# Patient Record
Sex: Male | Born: 1935 | Hispanic: No | Smoking: Never smoker
Health system: Southern US, Community
[De-identification: ages and names within clinical notes are randomized; demographics above are authoritative.]

## PROBLEM LIST (undated history)

## (undated) HISTORY — PX: HERNIA REPAIR: SHX51

---

## 2009-02-20 ENCOUNTER — Emergency Department: Payer: Self-pay | Admitting: Emergency Medicine

## 2011-05-08 ENCOUNTER — Emergency Department: Payer: Self-pay | Admitting: *Deleted

## 2012-06-27 ENCOUNTER — Emergency Department: Payer: Self-pay

## 2012-06-27 LAB — URINALYSIS, COMPLETE
Bacteria: NONE SEEN
Bilirubin,UR: NEGATIVE
Hyaline Cast: 1
Leukocyte Esterase: NEGATIVE
Nitrite: NEGATIVE
Ph: 5 (ref 4.5–8.0)
RBC,UR: 4 /HPF (ref 0–5)
Specific Gravity: 1.027 (ref 1.003–1.030)
WBC UR: 2 /HPF (ref 0–5)

## 2012-06-27 LAB — COMPREHENSIVE METABOLIC PANEL
Albumin: 4.2 g/dL (ref 3.4–5.0)
Anion Gap: 6 — ABNORMAL LOW (ref 7–16)
BUN: 15 mg/dL (ref 7–18)
Creatinine: 0.95 mg/dL (ref 0.60–1.30)
Glucose: 119 mg/dL — ABNORMAL HIGH (ref 65–99)
Osmolality: 281 (ref 275–301)
Potassium: 4 mmol/L (ref 3.5–5.1)
Sodium: 140 mmol/L (ref 136–145)
Total Protein: 7.8 g/dL (ref 6.4–8.2)

## 2012-06-27 LAB — CBC
HCT: 42.8 % (ref 40.0–52.0)
MCHC: 33.4 g/dL (ref 32.0–36.0)
MCV: 95 fL (ref 80–100)
Platelet: 197 10*3/uL (ref 150–440)
RBC: 4.53 10*6/uL (ref 4.40–5.90)
RDW: 13.4 % (ref 11.5–14.5)
WBC: 12.1 10*3/uL — ABNORMAL HIGH (ref 3.8–10.6)

## 2013-06-28 ENCOUNTER — Emergency Department: Payer: Self-pay | Admitting: Emergency Medicine

## 2013-06-28 LAB — CBC
HGB: 13.9 g/dL (ref 13.0–18.0)
MCH: 32.5 pg (ref 26.0–34.0)
MCHC: 34.6 g/dL (ref 32.0–36.0)
MCV: 94 fL (ref 80–100)
Platelet: 169 10*3/uL (ref 150–440)
RBC: 4.27 10*6/uL — ABNORMAL LOW (ref 4.40–5.90)
RDW: 13.5 % (ref 11.5–14.5)
WBC: 7.9 10*3/uL (ref 3.8–10.6)

## 2013-06-28 LAB — BASIC METABOLIC PANEL
Anion Gap: 3 — ABNORMAL LOW (ref 7–16)
BUN: 15 mg/dL (ref 7–18)
Chloride: 106 mmol/L (ref 98–107)
Creatinine: 1.39 mg/dL — ABNORMAL HIGH (ref 0.60–1.30)
EGFR (African American): 57 — ABNORMAL LOW
EGFR (Non-African Amer.): 50 — ABNORMAL LOW
Glucose: 138 mg/dL — ABNORMAL HIGH (ref 65–99)
Osmolality: 279 (ref 275–301)
Sodium: 138 mmol/L (ref 136–145)

## 2013-06-28 LAB — URINALYSIS, COMPLETE
Bacteria: NONE SEEN
Bilirubin,UR: NEGATIVE
Glucose,UR: NEGATIVE mg/dL (ref 0–75)
Ketone: NEGATIVE
Leukocyte Esterase: NEGATIVE
Protein: NEGATIVE
RBC,UR: 14 /HPF (ref 0–5)
Squamous Epithelial: <1
WBC UR: 2 /HPF (ref 0–5)

## 2013-06-28 LAB — HEPATIC FUNCTION PANEL A (ARMC)
Albumin: 3.8 g/dL (ref 3.4–5.0)
Alkaline Phosphatase: 58 U/L (ref 50–136)
Bilirubin,Total: 0.4 mg/dL (ref 0.2–1.0)

## 2013-06-28 LAB — LIPASE, BLOOD: Lipase: 107 U/L (ref 73–393)

## 2018-08-18 ENCOUNTER — Encounter: Payer: Self-pay | Admitting: Emergency Medicine

## 2018-08-18 ENCOUNTER — Emergency Department
Admission: EM | Admit: 2018-08-18 | Discharge: 2018-08-18 | Disposition: A | Discharge status: Home / Self Care | Payer: BLUE CROSS/BLUE SHIELD | Attending: Emergency Medicine | Admitting: Emergency Medicine

## 2018-08-18 ENCOUNTER — Other Ambulatory Visit: Payer: Self-pay

## 2018-08-18 ENCOUNTER — Emergency Department: Payer: BLUE CROSS/BLUE SHIELD

## 2018-08-18 DIAGNOSIS — G8929 Other chronic pain: Secondary | ICD-10-CM | POA: Insufficient documentation

## 2018-08-18 DIAGNOSIS — M1711 Unilateral primary osteoarthritis, right knee: Secondary | ICD-10-CM

## 2018-08-18 DIAGNOSIS — M25561 Pain in right knee: Secondary | ICD-10-CM | POA: Insufficient documentation

## 2018-08-18 MED ORDER — OXYCODONE HCL 5 MG PO TABS: 5.0000 mg | ORAL_TABLET | Freq: Three times a day (TID) | ORAL | 0 refills | Status: AC | PRN

## 2018-08-18 NOTE — Discharge Instructions (Signed)
Please follow-up with your primary care doctor as scheduled on Monday. Return to the emergency department for symptoms of change or worsen if you are unable to wait for that appointment.

## 2018-08-18 NOTE — ED Notes (Signed)
See triage note. Pt c/o R knee pain. States he has osteoarthritis in this knee but pain is usually not this bad. Has taken hydrocodone at home without relief.

## 2018-08-18 NOTE — ED Triage Notes (Signed)
Pt to ED from home c/o right knee pain, states usually goes to [facility] but pain more severe today.  Denies injury, no obvious swelling or redness, wearing brace for support.

## 2018-08-18 NOTE — ED Provider Notes (Signed)
[facility] Emergency Department Provider Note ____________________________________________  Time seen: Approximately 5:00 PM  I have reviewed the triage vital signs and the nursing notes.   HISTORY  Chief Complaint Knee Pain    HPI Johnny Beard is a 82 y.o. male who presents to the emergency department for evaluation and treatment of right knee pain.  Pain is acute on chronic.  Patient is being followed by his primary care provider for pain.  He was prescribed hydrocodone which has not helped.  He has been referred to orthopedics, but that appointment is not until mid December.  He is requesting something stronger for pain.  History reviewed. No pertinent past medical history.  There are no active problems to display for this patient.   Past Surgical History:  Procedure Laterality Date  . HERNIA REPAIR      Prior to Admission medications   Medication Sig Start Date End Date Taking? Authorizing Provider  oxyCODONE (ROXICODONE) 5 MG immediate release tablet Take 1 tablet (5 mg total) by mouth every 8 (eight) hours as needed. 08/18/18 08/18/19  Chinita Pester, FNP    Allergies Patient has no known allergies.  History reviewed. No pertinent family history.  Social History Social History   Tobacco Use  . Smoking status: Never Smoker  . Smokeless tobacco: Never Used  Substance Use Topics  . Alcohol use: Never    Frequency: Never  . Drug use: Never    Review of Systems Constitutional: Negative for fever. Cardiovascular: Negative for chest pain. Respiratory: Negative for shortness of breath. Musculoskeletal: Positive for right knee pain. Skin: Negative for open wound or lesion over exposed skin surfaces. Neurological: Negative for decrease in sensation  ____________________________________________   PHYSICAL EXAM:  VITAL SIGNS: ED Triage Vitals [08/18/18 1215]  Enc Vitals Group     BP (!) 163/77     Pulse Rate 98     Resp 18     Temp 97.9 F (36.6 C)     Temp Source Oral     SpO2 96 %     Weight 130 lb (59 kg)     Height 5\' 8"  (1.727 m)     Head Circumference      Peak Flow      Pain Score 8     Pain Loc      Pain Edu?      Excl. in GC?     Constitutional: Alert and oriented. Well appearing and in no acute distress. Eyes: Conjunctivae are clear without discharge or drainage Head: Atraumatic Neck: Supple. Respiratory: No cough. Respirations are even and unlabored. Musculoskeletal: Diffuse tenderness over the anterior right knee without obvious joint effusion.  Homans sign is negative.  There is no erythema or edema in the right calf. Neurologic: Awake, alert, oriented x4.  Motor and sensation is intact, specifically of the right lower extremity. Skin: No edema or erythema over the right knee. Psychiatric: Affect and behavior are appropriate.  ____________________________________________   LABS (all labs ordered are listed, but only abnormal results are displayed)  Labs Reviewed - No data to display ____________________________________________  RADIOLOGY  Tricompartmental osteoarthritis of the right knee, otherwise no acute findings. ____________________________________________   PROCEDURES  Procedures  ____________________________________________   INITIAL IMPRESSION / ASSESSMENT AND PLAN / ED COURSE  Johnny Beard is a 82 y.o. who presents to the emergency department for treatment and evaluation of acute on chronic right knee pain.  Patient now also states that he has been using  Voltaren gel in addition to the hydrocodone which is not providing him any relief.  He is requesting something stronger for pain which I feel is appropriate due to the degree of arthritis in the knee.  He was advised that he will need to change positions slowly because the medication may cause dizziness or drowsiness.  He states that he has an appointment scheduled with his primary care provider next week.  He was  advised that he would only receive a short supply of pain medication as the emergency department does not manage chronic pain and that he will need to get any additional refills from primary care or orthopedics.  Medications - No data to display  Pertinent labs & imaging results that were available during my care of the patient were reviewed by me and considered in my medical decision making (see chart for details).  _________________________________________   FINAL CLINICAL IMPRESSION(S) / ED DIAGNOSES  Final diagnoses:  Chronic pain of right knee  Arthritis of knee, right    ED Discharge Orders         Ordered    oxyCODONE (ROXICODONE) 5 MG immediate release tablet  Every 8 hours PRN     08/18/18 1416           If controlled substance prescribed during this visit, 12 month history viewed on the [facility] prior to issuing an initial prescription for Schedule II or III opiod.    Chinita Pester, FNP 08/18/18 1705    Sharman Cheek, MD 08/19/18 1521

## 2018-08-28 ENCOUNTER — Emergency Department
Admission: EM | Admit: 2018-08-28 | Discharge: 2018-08-28 | Disposition: A | Discharge status: Home / Self Care | Payer: BLUE CROSS/BLUE SHIELD | Attending: Emergency Medicine | Admitting: Emergency Medicine

## 2018-08-28 ENCOUNTER — Other Ambulatory Visit: Payer: Self-pay

## 2018-08-28 ENCOUNTER — Emergency Department: Payer: BLUE CROSS/BLUE SHIELD

## 2018-08-28 DIAGNOSIS — R002 Palpitations: Secondary | ICD-10-CM | POA: Diagnosis present

## 2018-08-28 LAB — COMPREHENSIVE METABOLIC PANEL
ALBUMIN: 3.4 g/dL — AB (ref 3.5–5.0)
ALT: 9 U/L (ref 0–44)
AST: 15 U/L (ref 15–41)
Alkaline Phosphatase: 44 U/L (ref 38–126)
Anion gap: 5 (ref 5–15)
BILIRUBIN TOTAL: 0.7 mg/dL (ref 0.3–1.2)
BUN: 17 mg/dL (ref 8–23)
CO2: 26 mmol/L (ref 22–32)
Calcium: 8 mg/dL — ABNORMAL LOW (ref 8.9–10.3)
Chloride: 109 mmol/L (ref 98–111)
Creatinine, Ser: 1 mg/dL (ref 0.61–1.24)
GFR calc Af Amer: >60 mL/min (ref >60)
Glucose, Bld: 116 mg/dL — ABNORMAL HIGH (ref 70–99)
POTASSIUM: 3.7 mmol/L (ref 3.5–5.1)
Sodium: 140 mmol/L (ref 135–145)
Total Protein: 6.2 g/dL — ABNORMAL LOW (ref 6.5–8.1)

## 2018-08-28 LAB — CBC WITH DIFFERENTIAL/PLATELET
Abs Immature Granulocytes: 0.05 10*3/uL (ref 0.00–0.07)
Basophils Absolute: 0.1 10*3/uL (ref 0.0–0.1)
Basophils Relative: 1 %
EOS ABS: 0.1 10*3/uL (ref 0.0–0.5)
Eosinophils Relative: 1 %
HEMATOCRIT: 39.3 % (ref 39.0–52.0)
HEMOGLOBIN: 13.1 g/dL (ref 13.0–17.0)
IMMATURE GRANULOCYTES: 1 %
LYMPHS ABS: 1.7 10*3/uL (ref 0.7–4.0)
LYMPHS PCT: 20 %
MCH: 32 pg (ref 26.0–34.0)
MCHC: 33.3 g/dL (ref 30.0–36.0)
MCV: 95.9 fL (ref 80.0–100.0)
MONOS PCT: 7 %
Monocytes Absolute: 0.6 10*3/uL (ref 0.1–1.0)
NEUTROS PCT: 70 %
Neutro Abs: 6.1 10*3/uL (ref 1.7–7.7)
Platelets: 210 10*3/uL (ref 150–400)
RBC: 4.1 MIL/uL — ABNORMAL LOW (ref 4.22–5.81)
RDW: 13.2 % (ref 11.5–15.5)
WBC: 8.6 10*3/uL (ref 4.0–10.5)
nRBC: 0 % (ref 0.0–0.2)

## 2018-08-28 LAB — TROPONIN I

## 2018-08-28 LAB — TSH: TSH: 2.414 u[IU]/mL (ref 0.350–4.500)

## 2018-08-28 LAB — T4, FREE: Free T4: 0.98 ng/dL (ref 0.82–1.77)

## 2018-08-28 MED ORDER — OXYCODONE-ACETAMINOPHEN 5-325 MG PO TABS
1.0000 | ORAL_TABLET | Freq: Once | ORAL | Status: AC
  Administered 2018-08-28: 1 via ORAL

## 2018-08-28 MED ORDER — OXYCODONE-ACETAMINOPHEN 5-325 MG PO TABS
ORAL_TABLET | ORAL | Status: AC
  Administered 2018-08-28: 1 via ORAL
  Filled 2018-08-28: qty 1

## 2018-08-28 NOTE — ED Notes (Signed)
Reviewed discharge instructions, follow-up care with patient. Patient verbalized understanding of all information reviewed. Patient stable, with no distress noted at this time.  Patient in subwait, waiting for ride. Patient provided blanket and call bell. Charge RN, triage RN, and first nurse aware.

## 2018-08-28 NOTE — ED Provider Notes (Signed)
[facility] Emergency Department Provider Note  ____________________________________________   First MD Initiated Contact with Patient 08/28/18 0147     (approximate)  I have reviewed the triage vital signs and the nursing notes.   HISTORY  Chief Complaint Tachycardia   HPI Johnny Beard is a 82 y.o. male who comes to the emergency department via EMS with palpitations that began around an hour and a half prior to arrival.  He was lying in bed and was awoken at around 12:30 AM feeling like "my heart was racing".  His symptoms lasted about 10 minutes and subsequently resolved.  He has no known cardiac history however does follow with cardiology at [facility] for hypertension.  His symptoms came on suddenly were moderate to severe and passed away quickly on their own.  He has had no recent illness.  He did not pass out.  He has no chest pain or shortness of breath.  No nausea or vomiting.  His symptoms are nonradiating.  No leg swelling.  No recent surgery travel or immobilization.   No past medical history on file.  There are no active problems to display for this patient.   Past Surgical History:  Procedure Laterality Date  . HERNIA REPAIR      Prior to Admission medications   Medication Sig Start Date End Date Taking? Authorizing Provider  oxyCODONE (ROXICODONE) 5 MG immediate release tablet Take 1 tablet (5 mg total) by mouth every 8 (eight) hours as needed. 08/18/18 08/18/19  Chinita Pester, FNP    Allergies Patient has no known allergies.  No family history on file.  Social History Social History   Tobacco Use  . Smoking status: Never Smoker  . Smokeless tobacco: Never Used  Substance Use Topics  . Alcohol use: Never    Frequency: Never  . Drug use: Never    Review of Systems Constitutional: No fever/chills Eyes: No visual changes. ENT: No sore throat. Cardiovascular: Denies chest pain. Respiratory: Denies shortness of  breath. Gastrointestinal: No abdominal pain.  No nausea, no vomiting.  No diarrhea.  No constipation. Genitourinary: Negative for dysuria. Musculoskeletal: Negative for back pain. Skin: Negative for rash. Neurological: Negative for headaches, focal weakness or numbness.   ____________________________________________   PHYSICAL EXAM:  VITAL SIGNS: ED Triage Vitals  Enc Vitals Group     BP      Pulse      Resp      Temp      Temp src      SpO2      Weight      Height      Head Circumference      Peak Flow      Pain Score      Pain Loc      Pain Edu?      Excl. in GC?     Constitutional: Alert and oriented x4 nontoxic no diaphoresis speaks in full clear sentences Eyes: PERRL EOMI. Head: Atraumatic. Nose: No congestion/rhinnorhea. Mouth/Throat: No trismus Neck: No stridor.   Cardiovascular: Normal rate, regular rhythm. Grossly normal heart sounds.  Good peripheral circulation.  Specifically no left ear murmur appreciated Respiratory: Normal respiratory effort.  No retractions. Lungs CTAB and moving good air Gastrointestinal: Soft nontender Musculoskeletal: No lower extremity edema   Neurologic:  Normal speech and language. No gross focal neurologic deficits are appreciated. Skin:  Skin is warm, dry and intact. No rash noted. Psychiatric: Mood and affect are normal. Speech and behavior are  normal.    ____________________________________________   DIFFERENTIAL includes but not limited to  SVT, atrial fibrillation, ventricular tachycardia, sinus tachycardia ____________________________________________   LABS (all labs ordered are listed, but only abnormal results are displayed)  Labs Reviewed  COMPREHENSIVE METABOLIC PANEL - Abnormal; Notable for the following components:      Result Value   Glucose, Bld 116 (*)    Calcium 8.0 (*)    Total Protein 6.2 (*)    Albumin 3.4 (*)    All other components within normal limits  CBC WITH DIFFERENTIAL/PLATELET -  Abnormal; Notable for the following components:   RBC 4.10 (*)    All other components within normal limits  TROPONIN I  TSH  T4, FREE    Lab work reviewed by me shows low albumin consistent with chronically poor nutrition otherwise unremarkable __________________________________________  EKG  ED ECG REPORT I, Merrily Brittle, the attending physician, personally viewed and interpreted this ECG.  Date: 08/28/2018 EKG Time:  Rate: 77 Rhythm: normal sinus rhythm QRS Axis: normal Intervals: normal ST/T Wave abnormalities: normal Narrative Interpretation: no evidence of acute ischemia  ____________________________________________  RADIOLOGY  Test x-ray reviewed by me with no acute disease ____________________________________________   PROCEDURES  Procedure(s) performed: no  Procedures  Critical Care performed: no  ____________________________________________   INITIAL IMPRESSION / ASSESSMENT AND PLAN / ED COURSE  Pertinent labs & imaging results that were available during my care of the patient were reviewed by me and considered in my medical decision making (see chart for details).   As part of my medical decision making, I reviewed the following data within the electronic MEDICAL RECORD NUMBER History obtained from family if available, nursing notes, old chart and ekg, as well as notes from prior ED visits.  The patient is an elderly man with a history of hypertension and dyslipidemia who comes to the emergency department with a 10-minute episode of unprovoked palpitations.  Lab work here is reassuring including thyroid function and no signs of dehydration.  EKG is normal sinus rhythm with no signs of ectopy.  Kept on monitor for 2 hours with no arrhythmia.  Is unclear exactly what happened but he is high risk for atrial fibrillation or another dysrhythmia.  He already has a cardiologist at [facility] and I will refer him back this coming week for reevaluation.  He can consider  Holter monitoring at that point.  Strict return precautions were given.      ____________________________________________   FINAL CLINICAL IMPRESSION(S) / ED DIAGNOSES  Final diagnoses:  Palpitations      NEW MEDICATIONS STARTED DURING THIS VISIT:  Discharge Medication List as of 08/28/2018  3:10 AM       Note:  This document was prepared using Dragon voice recognition software and may include unintentional dictation errors.     Merrily Brittle, MD 08/31/18 2122

## 2018-08-28 NOTE — ED Triage Notes (Signed)
Patient to RM 2 via EMS from home.  [facility] patient reports woke around 12:30 feeling like his heart was racing.  EMS treatment:  12 lead showing normal sinus rhythm, HR 79, BP 149/78, Pulse oxi 96% on room air, CBG 131, saline loc to left forearm via 20g angiocath.

## 2018-08-28 NOTE — Discharge Instructions (Signed)
Fortunately today your chest x-ray, lab work, and 2 EKGs were reassuring.  Please make an appointment to follow-up with your cardiologist within 1 week for a recheck and return to the emergency department sooner for any concerns.  It was a pleasure to take care of you today, and thank you for coming to our emergency department.  If you have any questions or concerns before leaving please ask the nurse to grab me and I'm more than happy to go through your aftercare instructions again.  If you were prescribed any opioid pain medication today such as Norco, Vicodin, Percocet, morphine, hydrocodone, or oxycodone please make sure you do not drive when you are taking this medication as it can alter your ability to drive safely.  If you have any concerns once you are home that you are not improving or are in fact getting worse before you can make it to your follow-up appointment, please do not hesitate to call 911 and come back for further evaluation.  Merrily Brittle, MD  Results for orders placed or performed during the hospital encounter of 08/28/18  Troponin I  Result Value Ref Range   Troponin I <0.03 <0.03 ng/mL  Comprehensive metabolic panel  Result Value Ref Range   Sodium 140 135 - 145 mmol/L   Potassium 3.7 3.5 - 5.1 mmol/L   Chloride 109 98 - 111 mmol/L   CO2 26 22 - 32 mmol/L   Glucose, Bld 116 (H) 70 - 99 mg/dL   BUN 17 8 - 23 mg/dL   Creatinine, Ser 1.61 0.61 - 1.24 mg/dL   Calcium 8.0 (L) 8.9 - 10.3 mg/dL   Total Protein 6.2 (L) 6.5 - 8.1 g/dL   Albumin 3.4 (L) 3.5 - 5.0 g/dL   AST 15 15 - 41 U/L   ALT 9 0 - 44 U/L   Alkaline Phosphatase 44 38 - 126 U/L   Total Bilirubin 0.7 0.3 - 1.2 mg/dL   GFR calc non Af Amer >60 >60 mL/min   GFR calc Af Amer >60 >60 mL/min   Anion gap 5 5 - 15  CBC with Differential  Result Value Ref Range   WBC 8.6 4.0 - 10.5 K/uL   RBC 4.10 (L) 4.22 - 5.81 MIL/uL   Hemoglobin 13.1 13.0 - 17.0 g/dL   HCT 09.6 04.5 - 40.9 %   MCV 95.9 80.0 - 100.0  fL   MCH 32.0 26.0 - 34.0 pg   MCHC 33.3 30.0 - 36.0 g/dL   RDW 81.1 91.4 - 78.2 %   Platelets 210 150 - 400 K/uL   nRBC 0.0 0.0 - 0.2 %   Neutrophils Relative % 70 %   Neutro Abs 6.1 1.7 - 7.7 K/uL   Lymphocytes Relative 20 %   Lymphs Abs 1.7 0.7 - 4.0 K/uL   Monocytes Relative 7 %   Monocytes Absolute 0.6 0.1 - 1.0 K/uL   Eosinophils Relative 1 %   Eosinophils Absolute 0.1 0.0 - 0.5 K/uL   Basophils Relative 1 %   Basophils Absolute 0.1 0.0 - 0.1 K/uL   Immature Granulocytes 1 %   Abs Immature Granulocytes 0.05 0.00 - 0.07 K/uL  TSH  Result Value Ref Range   TSH 2.414 0.350 - 4.500 uIU/mL  T4, free  Result Value Ref Range   Free T4 0.98 0.82 - 1.77 ng/dL   Dg Chest Port 1 View  Result Date: 08/28/2018 CLINICAL DATA:  Tachycardia. EXAM: PORTABLE CHEST 1 VIEW COMPARISON:  02/20/2009 FINDINGS: Lungs  are adequately inflated without focal airspace consolidation, effusion or pneumothorax. Cardiomediastinal silhouette and remainder of the exam is unchanged. IMPRESSION: No active disease. Electronically Signed   By: Elberta Fortis M.D.   On: 08/28/2018 02:16   Dg Knee Complete 4 Views Right  Result Date: 08/18/2018 CLINICAL DATA:  chronic diffuse right knee pain for past 3 years. Reports pain has become worse over the past 2 days. Denies any injury to right knee. Denies any previous injury to right knee. Patient reports he was told by his PCP that he had arthritis in his right knee. EXAM: RIGHT KNEE - COMPLETE 4+ VIEW COMPARISON:  None. FINDINGS: Small marginal spurs about all 3 compartments of the knee. No fracture, dislocation, or effusion. Mild osteopenia. Regional soft tissues unremarkable. IMPRESSION: Early tricompartmental degenerative spurring.  No acute findings. Electronically Signed   By: Corlis Leak M.D.   On: 08/18/2018 13:08

## 2019-05-24 IMAGING — DX DG CHEST 1V PORT
2 series · 2 of 2 positions shown · non-contrast
Comparison: 02/20/2009

CLINICAL DATA: Tachycardia.

EXAM:
PORTABLE CHEST 1 VIEW

[chest ap (1 of 2)]
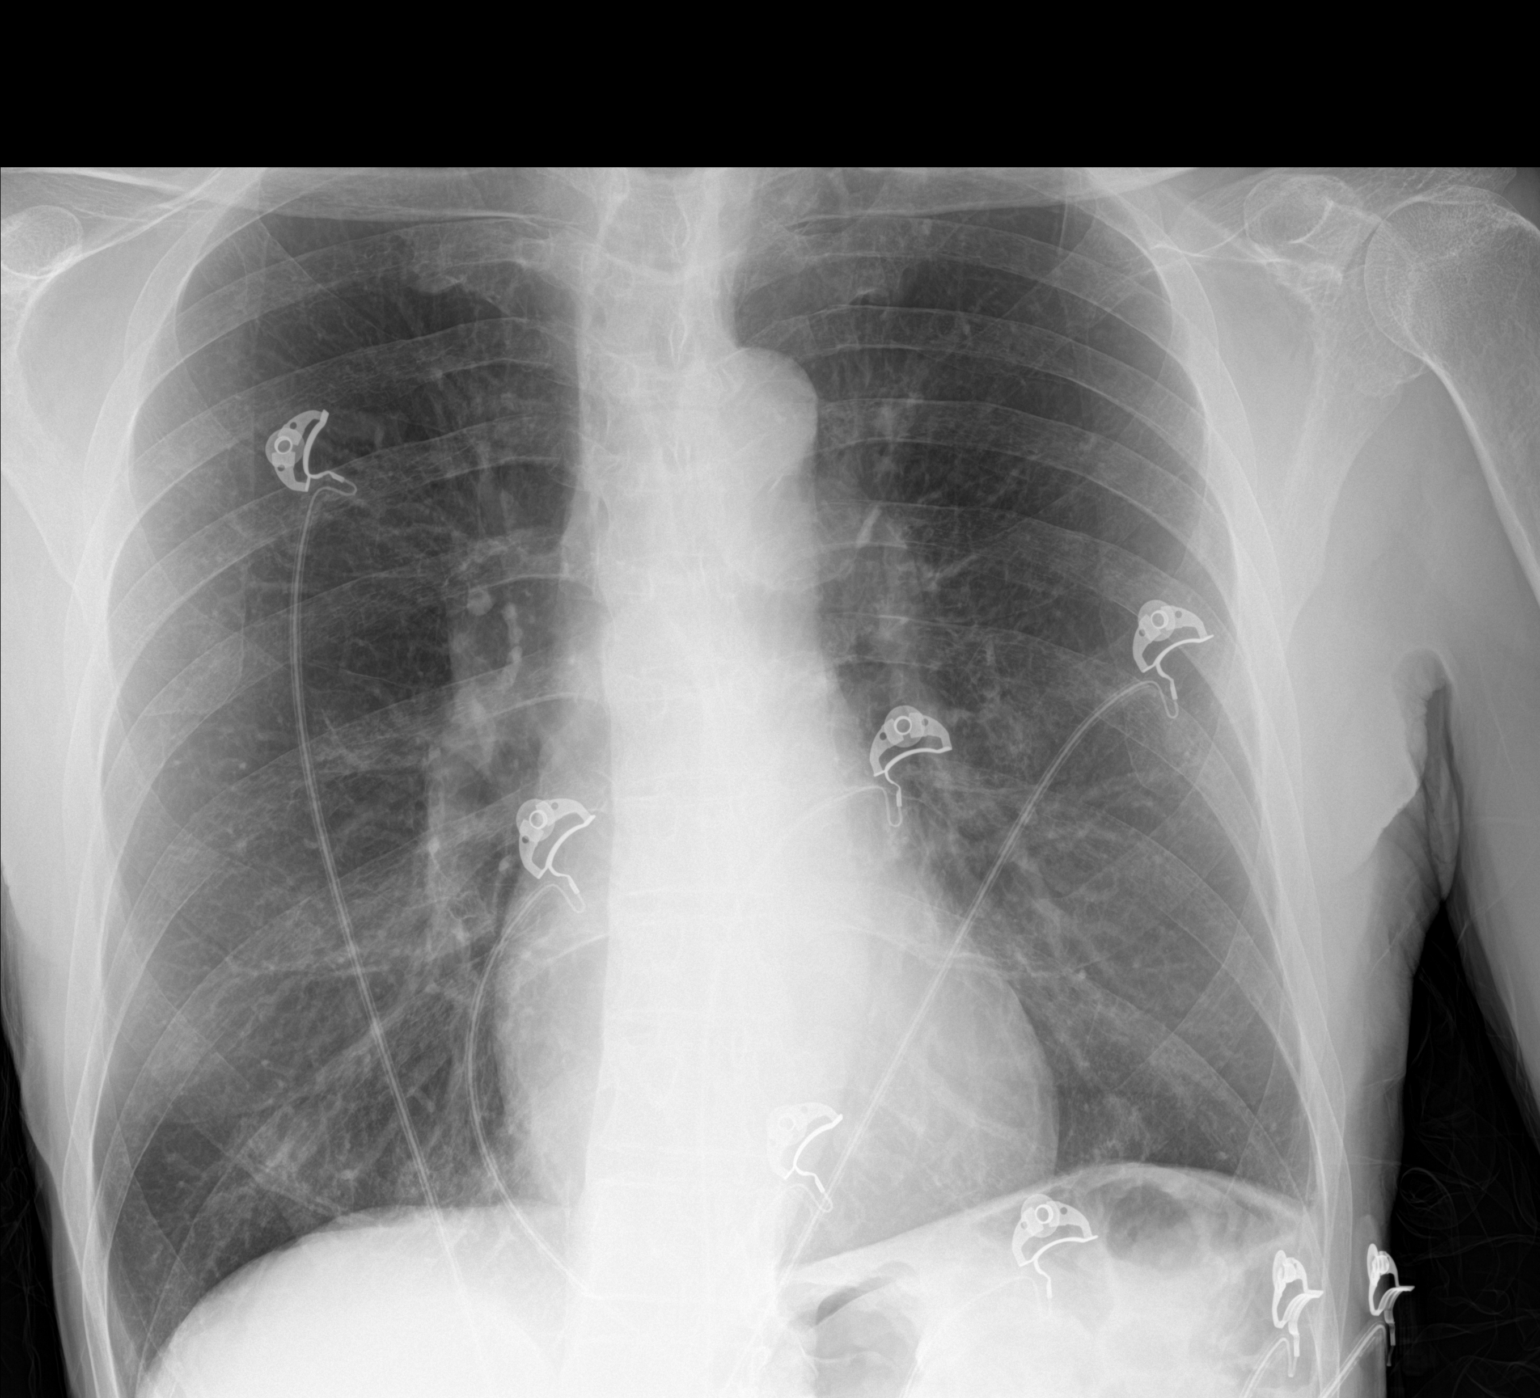

[chest ap (2 of 2)]
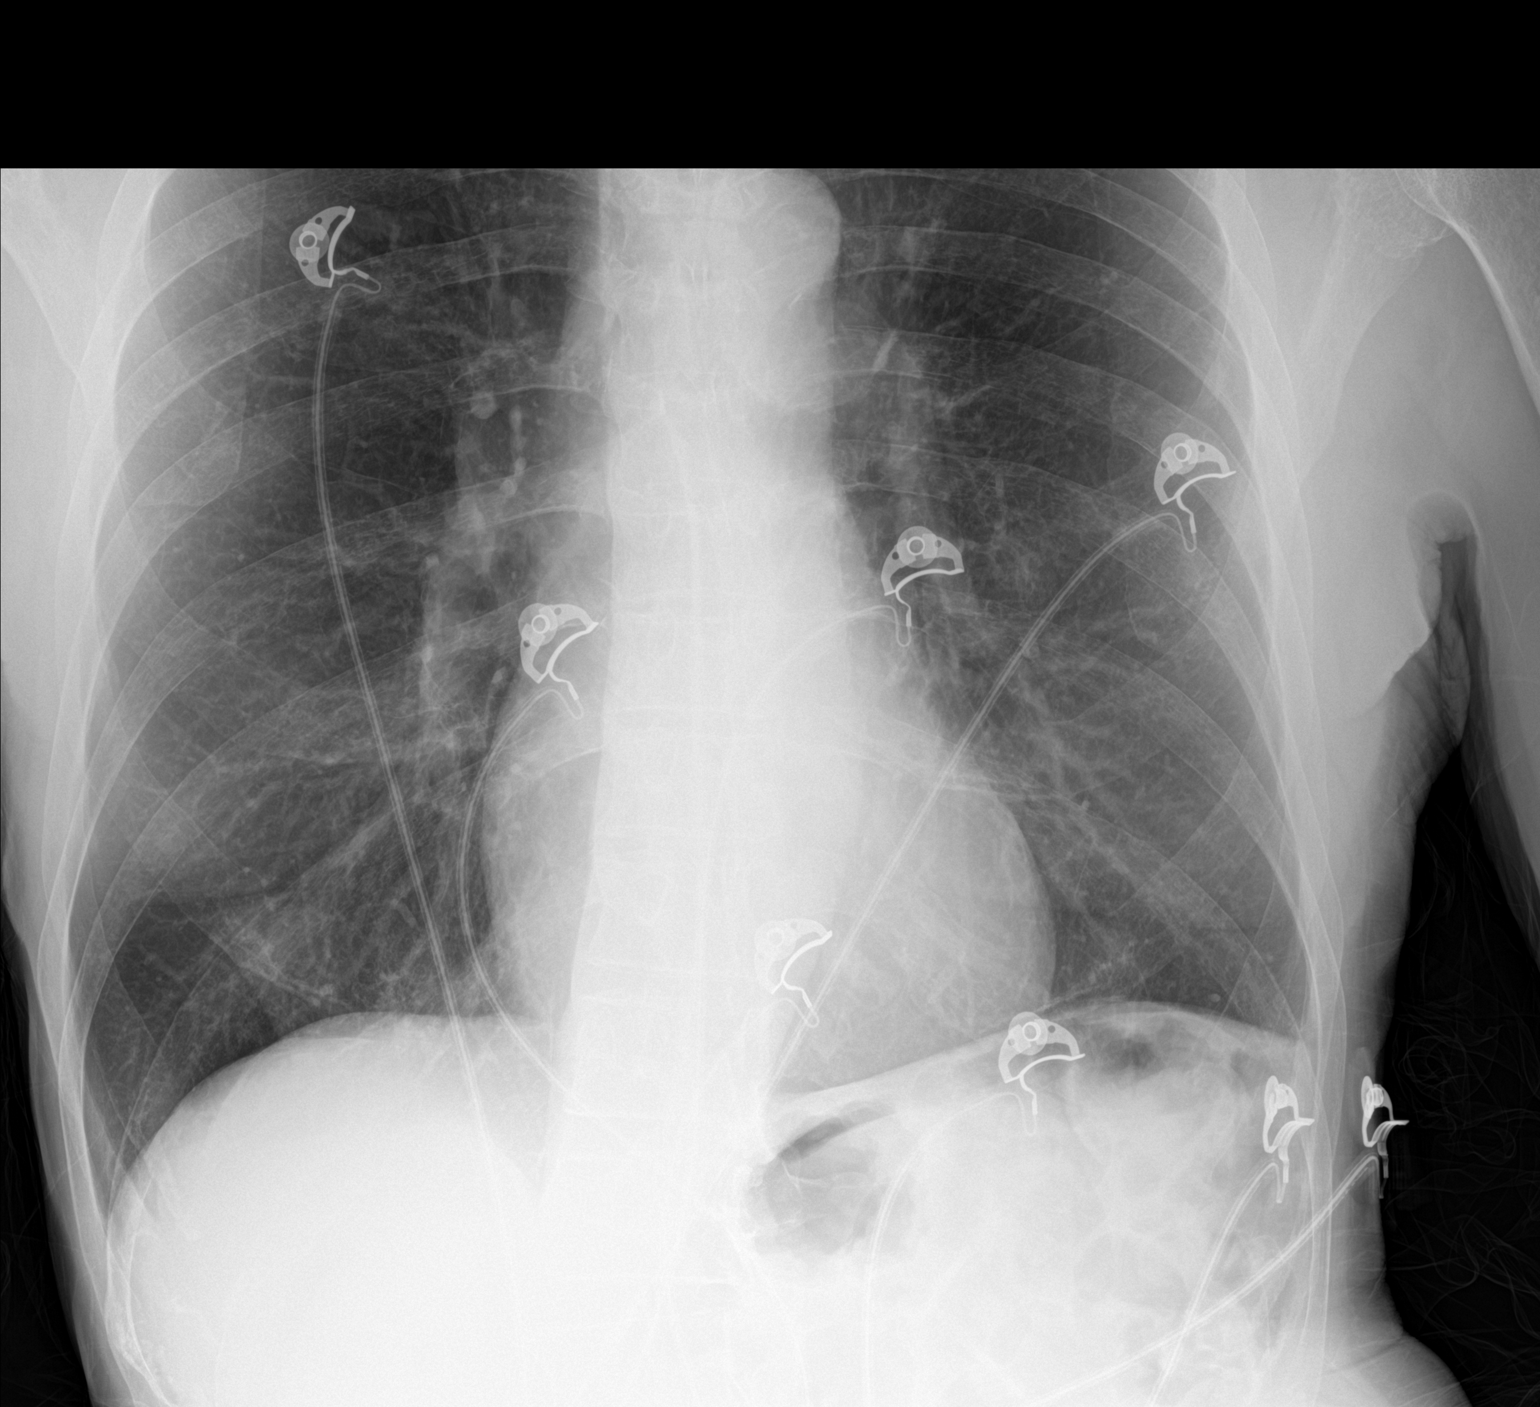

[2 of 2 positions shown; findings below may reference images not displayed]

FINDINGS: Lungs are adequately inflated without focal airspace consolidation,
effusion or pneumothorax. Cardiomediastinal silhouette and remainder
of the exam is unchanged.
IMPRESSION: No active disease.

## 2022-03-29 ENCOUNTER — Emergency Department: Payer: Medicare Other

## 2022-03-29 ENCOUNTER — Emergency Department
Admission: EM | Admit: 2022-03-29 | Discharge: 2022-03-29 | Disposition: A | Discharge status: Home / Self Care | Payer: Medicare Other | Attending: Emergency Medicine | Admitting: Emergency Medicine

## 2022-03-29 DIAGNOSIS — R42 Dizziness and giddiness: Secondary | ICD-10-CM | POA: Insufficient documentation

## 2022-03-29 DIAGNOSIS — R200 Anesthesia of skin: Secondary | ICD-10-CM | POA: Diagnosis not present

## 2022-03-29 DIAGNOSIS — I10 Essential (primary) hypertension: Secondary | ICD-10-CM | POA: Insufficient documentation

## 2022-03-29 LAB — COMPREHENSIVE METABOLIC PANEL
ALT: 12 U/L (ref 0–44)
AST: 17 U/L (ref 15–41)
Albumin: 4.3 g/dL (ref 3.5–5.0)
Alkaline Phosphatase: 55 U/L (ref 38–126)
Anion gap: 7 (ref 5–15)
BUN: 15 mg/dL (ref 8–23)
CO2: 27 mmol/L (ref 22–32)
Calcium: 9 mg/dL (ref 8.9–10.3)
Chloride: 104 mmol/L (ref 98–111)
Creatinine, Ser: 0.96 mg/dL (ref 0.61–1.24)
GFR, Estimated: >60 mL/min (ref >60)
Glucose, Bld: 110 mg/dL — ABNORMAL HIGH (ref 70–99)
Potassium: 4.1 mmol/L (ref 3.5–5.1)
Sodium: 138 mmol/L (ref 135–145)
Total Bilirubin: 0.9 mg/dL (ref 0.3–1.2)
Total Protein: 7.5 g/dL (ref 6.5–8.1)

## 2022-03-29 LAB — CBC WITH DIFFERENTIAL/PLATELET
Abs Immature Granulocytes: 0.05 10*3/uL (ref 0.00–0.07)
Basophils Absolute: 0.1 10*3/uL (ref 0.0–0.1)
Basophils Relative: 1 %
Eosinophils Absolute: 0 10*3/uL (ref 0.0–0.5)
Eosinophils Relative: 0 %
HCT: 44.8 % (ref 39.0–52.0)
Hemoglobin: 14.4 g/dL (ref 13.0–17.0)
Immature Granulocytes: 1 %
Lymphocytes Relative: 15 %
Lymphs Abs: 1.3 10*3/uL (ref 0.7–4.0)
MCH: 30.6 pg (ref 26.0–34.0)
MCHC: 32.1 g/dL (ref 30.0–36.0)
MCV: 95.3 fL (ref 80.0–100.0)
Monocytes Absolute: 0.5 10*3/uL (ref 0.1–1.0)
Monocytes Relative: 6 %
Neutro Abs: 6.5 10*3/uL (ref 1.7–7.7)
Neutrophils Relative %: 77 %
Platelets: 243 10*3/uL (ref 150–400)
RBC: 4.7 MIL/uL (ref 4.22–5.81)
RDW: 14.5 % (ref 11.5–15.5)
WBC: 8.4 10*3/uL (ref 4.0–10.5)
nRBC: 0 % (ref 0.0–0.2)

## 2022-03-29 NOTE — Discharge Instructions (Signed)
Your CAT scan blood work and exam were all reassuring today.  Please return to the emergency department if you develop any new weakness visual change or difficulty speaking or your dizziness returns and does not go away.

## 2022-03-29 NOTE — ED Triage Notes (Signed)
Dizziness 1 hour prior , left lip , no cp, no weakness, no slurred speech, pt from home

## 2022-03-29 NOTE — ED Provider Notes (Signed)
[facility] Provider Note    Event Date/Time   First MD Initiated Contact with Patient 03/29/22 1552     (approximate)   History   Dizziness (Dizziness 1 hour prior , left lip , no cp, no weakness, no slurred speech, pt from home )   HPI  Johnny Beard is a 86 y.o. male   with past medical history of hypertension BPH who presents with dizziness/lightheadedness.  Symptoms started about an hour prior to arrival.  Patient endorses feeling like he is going to pass out.  He also endorses isolated numbness of the left lower lip.  Denies other numbness tingling weakness denies visual change aphasia or difficulty ambulating he denies chest pain or shortness of breath.  Thinks he may be dehydrated but is otherwise eating and drinking normally.  No abdominal pain.     History reviewed. No pertinent past medical history.  There are no problems to display for this patient.    Physical Exam  Triage Vital Signs: ED Triage Vitals  Enc Vitals Group     BP 03/29/22 1557 (!) 178/97     Pulse Rate 03/29/22 1554 83     Resp 03/29/22 1554 17     Temp 03/29/22 1554 97.7 F (36.5 C)     Temp Source 03/29/22 1554 Oral     SpO2 03/29/22 1554 96 %     Weight 03/29/22 1555 143 lb 4.8 oz (65 kg)     Height 03/29/22 1555 5\' 7"  (1.702 m)     Head Circumference --      Peak Flow --      Pain Score --      Pain Loc --      Pain Edu? --      Excl. in GC? --     Most recent vital signs: Vitals:   03/29/22 1710 03/29/22 1747  BP: (!) 174/85 (!) 165/74  Pulse: 66 84  Resp: 13 17  Temp: 98.5 F (36.9 C) 98.3 F (36.8 C)  SpO2: 97% 96%     General: Awake, no distress.  CV:  Good peripheral perfusion.  Resp:  Normal effort.  Abd:  No distention.  Neuro:             Awake, Alert, Oriented x 3  Other:  Aox3, nml speech  PERRL, EOMI, face symmetric, nml tongue movement  Normal sensation of the face upper and lower 5/5 strength in the BL upper and lower  extremities  Sensation grossly intact in the BL upper and lower extremities  Finger-nose-finger intact BL    ED Results / Procedures / Treatments  Labs (all labs ordered are listed, but only abnormal results are displayed) Labs Reviewed  COMPREHENSIVE METABOLIC PANEL - Abnormal; Notable for the following components:      Result Value   Glucose, Bld 110 (*)    All other components within normal limits  CBC WITH DIFFERENTIAL/PLATELET     EKG  EKG interpretation performed by myself: NSR, nml axis, nml intervals, no acute ischemic changes    RADIOLOGY I reviewed and interpreted the CT scan of the brain which does not show any acute intracranial process    PROCEDURES:  Critical Care performed: No  Procedures  The patient is on the cardiac monitor to evaluate for evidence of arrhythmia and/or significant heart rate changes.   MEDICATIONS ORDERED IN ED: Medications - No data to display   IMPRESSION / MDM / ASSESSMENT AND PLAN / ED  COURSE  I reviewed the triage vital signs and the nursing notes.                              Differential diagnosis includes, but is not limited to, orthostatic hypotension, vestibular neuritis, posterior stroke  The patient is an 86 year old male who presents with dizziness/lightheadedness and left leg numbness.  This started an hour prior to arrival.  Describes a sensation of dizziness as like he was going to pass out.  Describes a numb feeling just in the left lower lip there is no other facial numbness and no other numbness tingling weakness of the rest of his body.  His neurologic exam is normal including subjective sensation of the face.  He is able to ambulate without difficulty.  Based on his exam my suspicion for a posterior CVA is low.  Question whether this is actually presyncope.  Will obtain screening labs EKG and a CT of the head.  Patient's lab work-up is reassuring, BC and CMP essentially normal.  CT head is without acute  abnormality.  On reassessment he no longer has any subjective numbness in the lip.  Again my suspicion for CVA based on this isolated lip numbness is low.  Do not feel like this is consistent with TIA.  Unclear what the cause of his symptoms was but given he is feeling better he is appropriate for discharge.  We discussed return precautions for any new neurologic symptoms.      FINAL CLINICAL IMPRESSION(S) / ED DIAGNOSES   Final diagnoses:  Dizziness     Rx / DC Orders   ED Discharge Orders     None        Note:  This document was prepared using Dragon voice recognition software and may include unintentional dictation errors.   Georga Hacking, MD 03/29/22 2223

## 2022-12-23 IMAGING — CT CT HEAD W/O CM
4 series · 17 of 47 positions shown, 19 images · non-contrast
Comparison: None Available.

CLINICAL DATA: Altered mental status.  Recent dizziness.



[Series 2: head wo · axial · 0.43mm/px · z∈[-121,+4]mm · 7 of 35 slices shown, 9 images]
[im 5/35  brain]
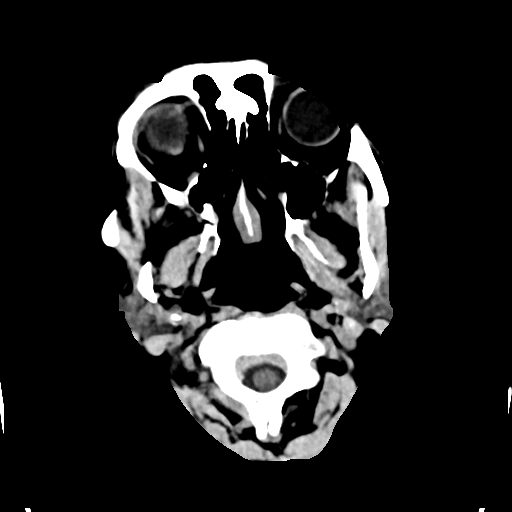
[im 5/35  bone]
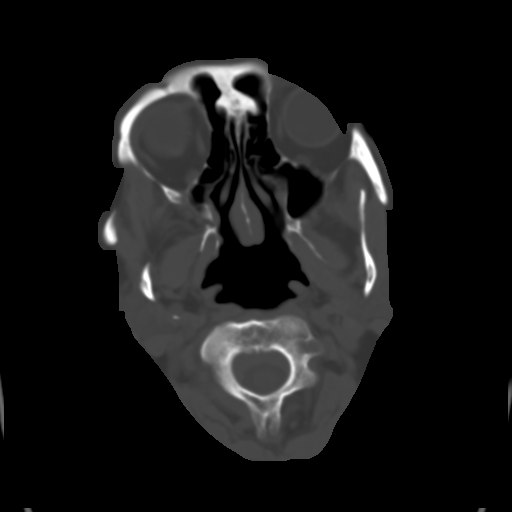
[im 9/35  brain]
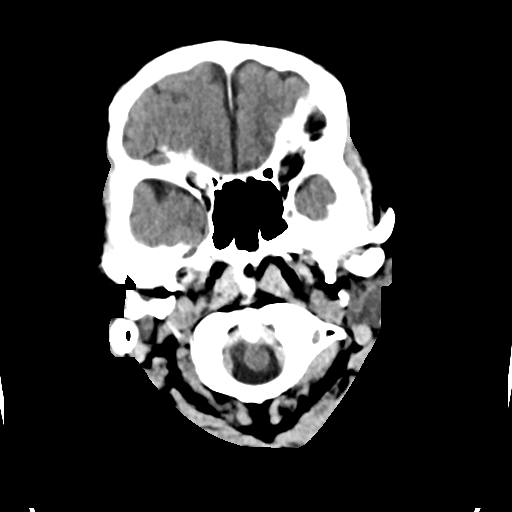
[im 13/35  brain]
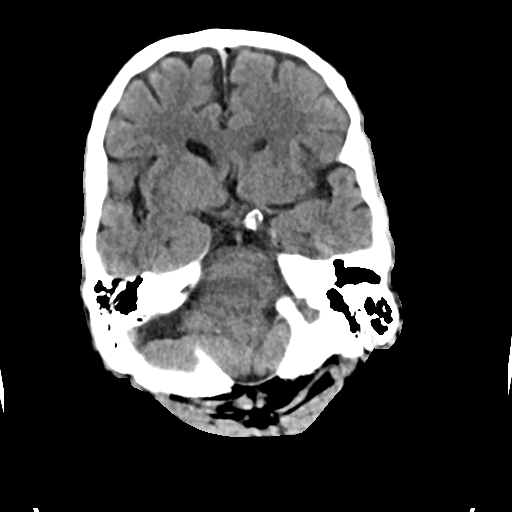
[im 18/35  brain]
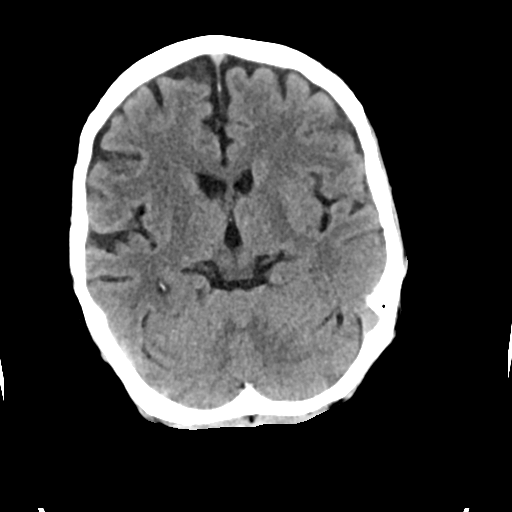
[im 22/35  brain]
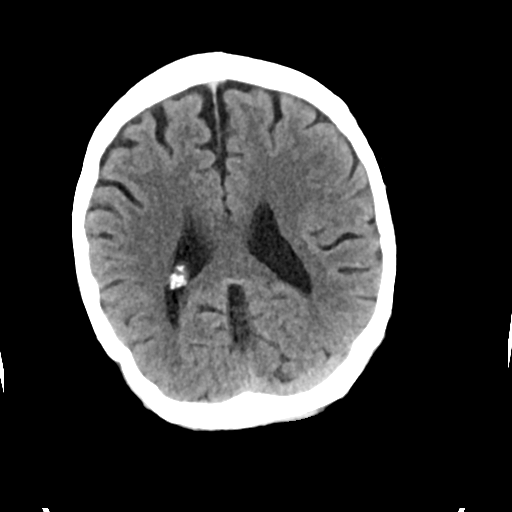
[im 22/35  bone]
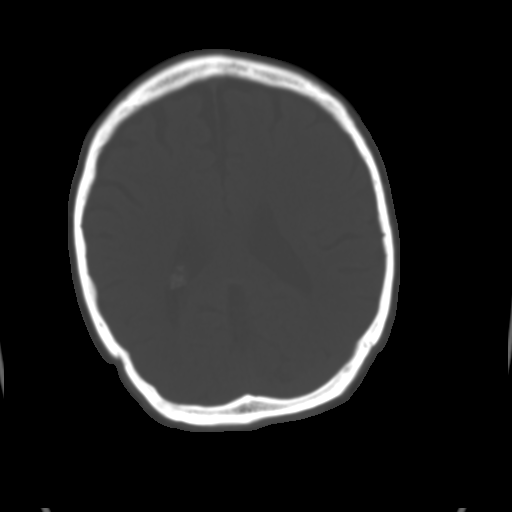
[im 26/35  brain]
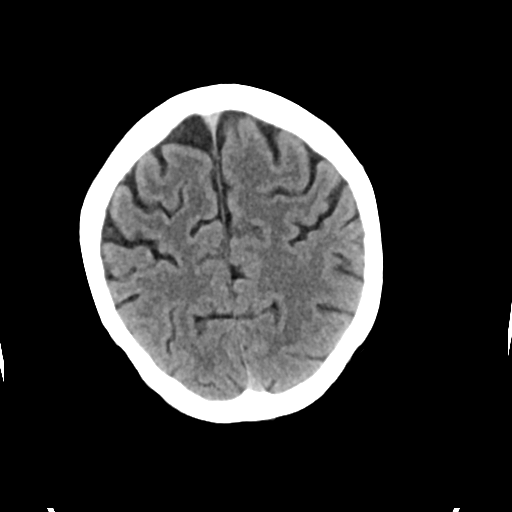
[im 30/35  brain]
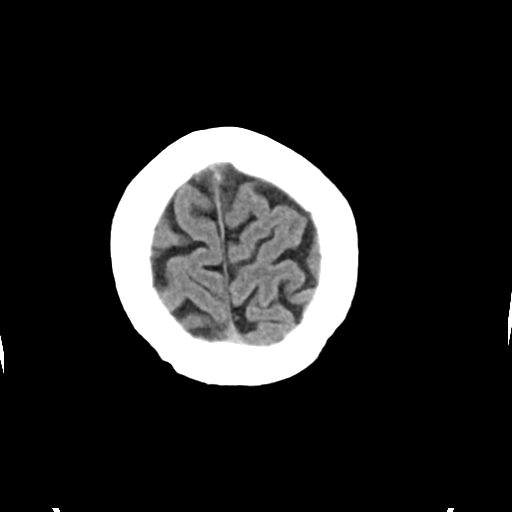

[Series 3: head bone · axial · 0.43mm/px · z∈[-125,-63]mm · 4 of 88 slices shown]
[im 9/88  bone]
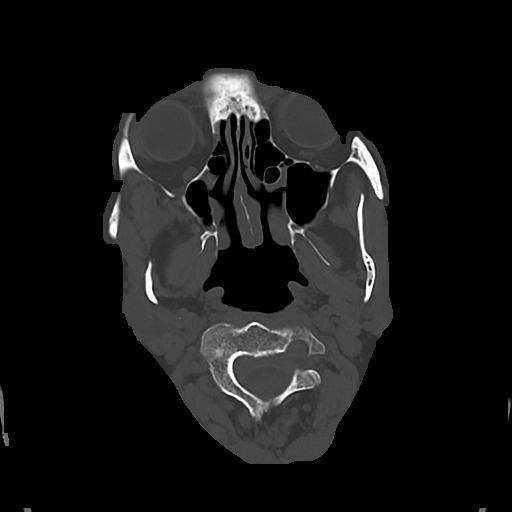
[im 18/88  bone]
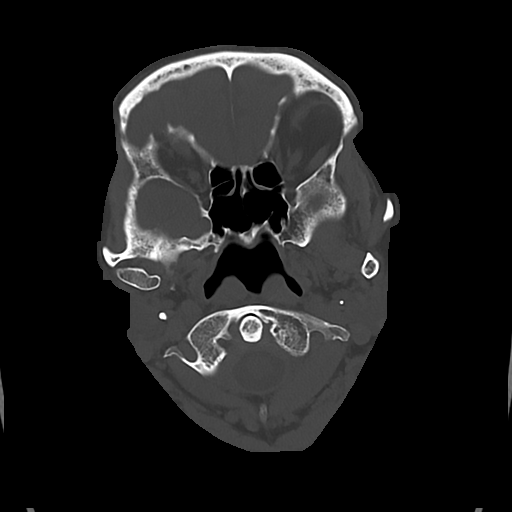
[im 27/88  bone]
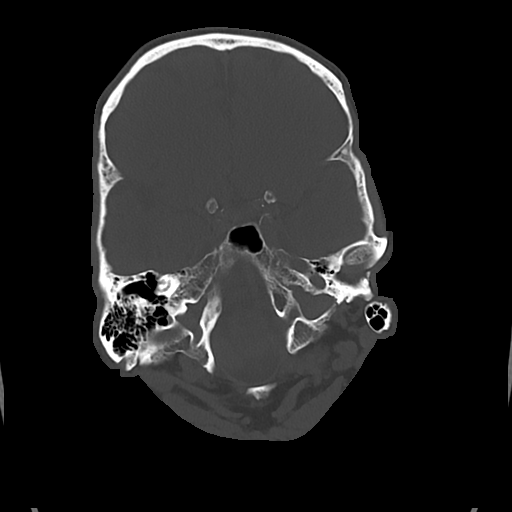
[im 40/88  bone]
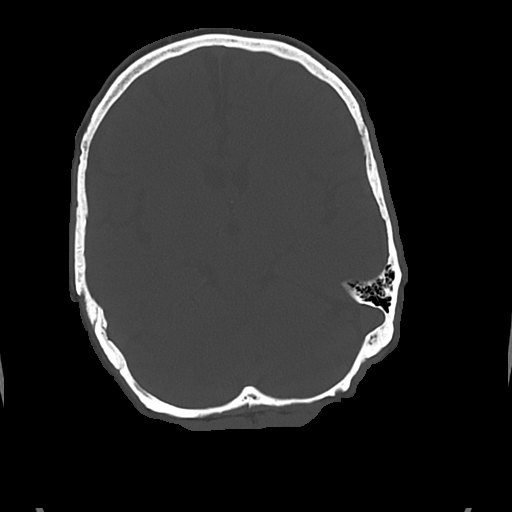

[Series 4: cor soft · coronal · 0.35mm/px · 3 of 79 slices shown]
[im 32/79  brain]
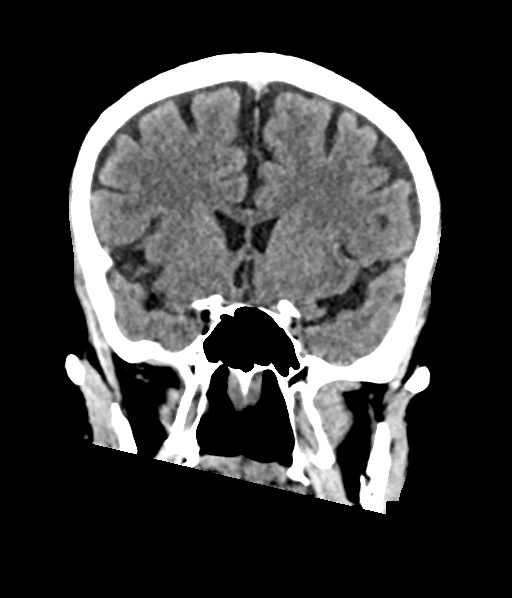
[im 37/79  brain]
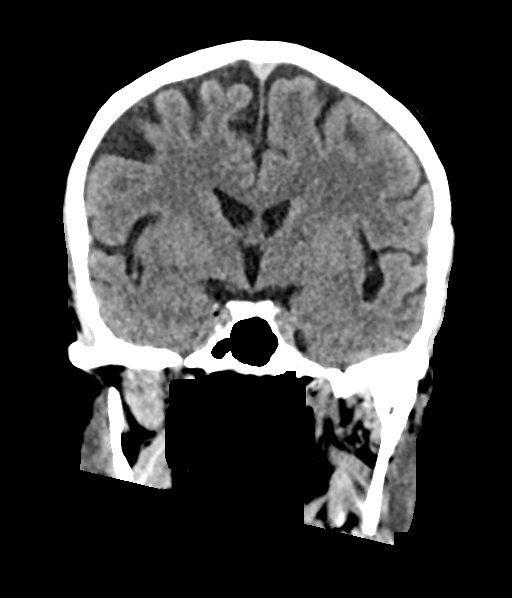
[im 42/79  brain]
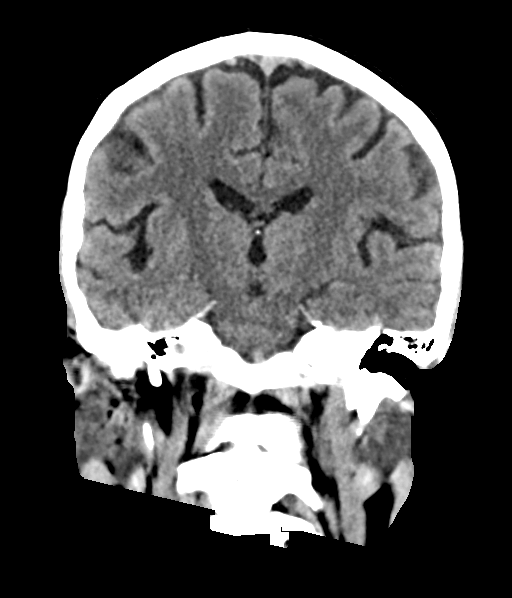

[Series 5: sag soft · sagittal · 0.44mm/px · 3 of 60 slices shown]
[im 25/60  brain]
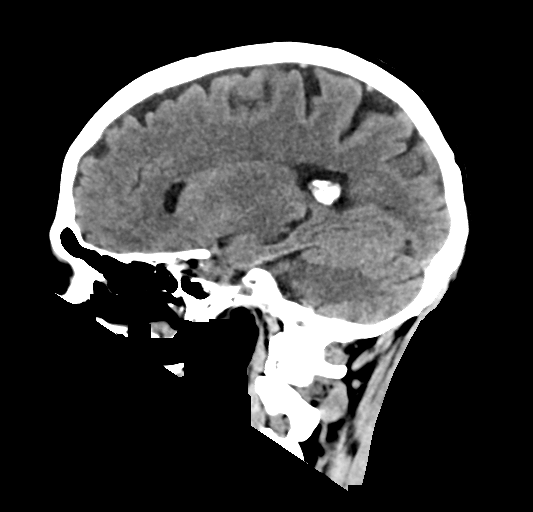
[im 30/60  brain]
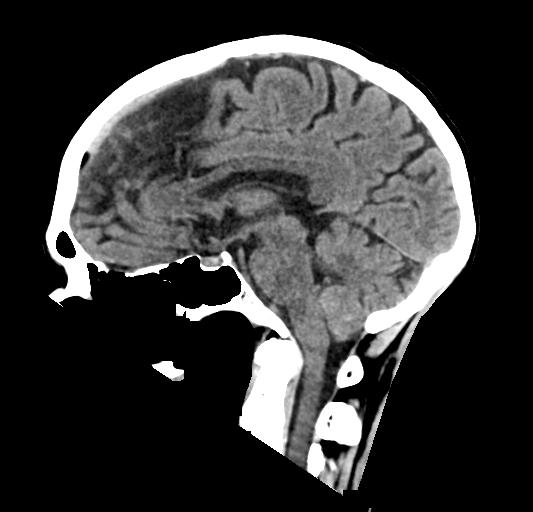
[im 36/60  brain]
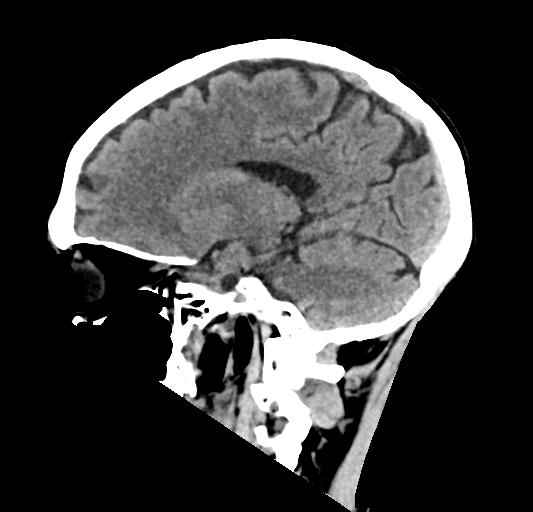

[17 of 47 positions shown; findings below may reference images not displayed]

FINDINGS: Brain: Mildly enlarged ventricles and cortical sulci. No
intracranial hemorrhage, mass lesion or CT evidence of acute
infarction.

Vascular: No hyperdense vessel or unexpected calcification.

Skull: Normal. Negative for fracture or focal lesion.

Sinuses/Orbits: Unremarkable.

Other: Left concha bullosa. Enlarged bilateral medial maxillary
sinus ostia/surgical defects. Small exophytic left frontal skin
lesions.
IMPRESSION: No acute abnormality.

## 2023-06-27 ENCOUNTER — Telehealth: Payer: Self-pay

## 2023-06-27 ENCOUNTER — Telehealth: Payer: Self-pay | Admitting: Family Medicine

## 2023-06-27 NOTE — Telephone Encounter (Signed)
Client called to inquire about the mpox vaccine. He is traveling soon and would like to know if it is preferred that he get the vaccine. Spoke w/ client discussed where he was traveling -[place] ,[place] in October. Educated pt on transmission/where outbreaks have been more prevalent in [place]/those traveling to countries neighboring [facility]  and on information about vaccine. Client feels he is at risk and would like to receive the vaccine prior to traveling. Requested to schedule appt and appt made for 9/9. Advised client 2 dose series 4 weeks apart. Client states he is going this week for his Flu vaccine and asked about any contraindications. No contraindications noted. Client appreciative for assistance.

## 2023-06-27 NOTE — Telephone Encounter (Signed)
No additional notes required  

## 2023-07-11 ENCOUNTER — Ambulatory Visit: Payer: Self-pay

## 2023-07-11 ENCOUNTER — Ambulatory Visit: Payer: Medicare HMO

## 2023-07-11 DIAGNOSIS — Z23 Encounter for immunization: Secondary | ICD-10-CM | POA: Diagnosis not present

## 2023-07-11 DIAGNOSIS — Z719 Counseling, unspecified: Secondary | ICD-10-CM

## 2023-07-11 NOTE — Progress Notes (Signed)
In nurse clinic requesting Mpox vaccine. States he is traveling to [place] and wants protection. Whereas there is an outbreak of M Pox in that area of the world, Patient denies risk factors for Mpox, such as MSM, Medical laboratory scientific officer, Astronomer, sex workers. RN discussed transmission of Mpox with patient and he states he will not be participating in behaviors that increase risk of Mpox. Patient decides not to have Mpox today.  Per [facility], patient is due for Tdap which was given today and tolerated well. Updated [facility] copy given and explained. Questions answered and reports understanding. Jerel Shepherd, RN

## 2023-07-17 ENCOUNTER — Other Ambulatory Visit: Payer: Self-pay

## 2023-07-17 ENCOUNTER — Emergency Department
Admission: EM | Admit: 2023-07-17 | Discharge: 2023-07-17 | Disposition: A | Discharge status: Home / Self Care | Payer: Medicare HMO | Attending: Emergency Medicine | Admitting: Emergency Medicine

## 2023-07-17 DIAGNOSIS — R339 Retention of urine, unspecified: Secondary | ICD-10-CM | POA: Diagnosis present

## 2023-07-17 LAB — URINALYSIS, ROUTINE W REFLEX MICROSCOPIC
Bacteria, UA: NONE SEEN
Bilirubin Urine: NEGATIVE
Glucose, UA: NEGATIVE mg/dL
Ketones, ur: NEGATIVE mg/dL
Leukocytes,Ua: NEGATIVE
Nitrite: NEGATIVE
Protein, ur: NEGATIVE mg/dL
Specific Gravity, Urine: 1.009 (ref 1.005–1.030)
Squamous Epithelial / HPF: 0 /HPF (ref 0–5)
WBC, UA: 0 WBC/hpf (ref 0–5)
pH: 5 (ref 5.0–8.0)

## 2023-07-17 NOTE — ED Triage Notes (Signed)
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 Pt called EMS for inability to urinate since midnight. Pt currently in bathroom attempting to void. Hx of enlarged prostate. Pt alert and oriented   EMS Vitals:  HR 70 98% RA 168/80

## 2023-07-17 NOTE — ED Provider Notes (Signed)
[facility] Provider Note    Event Date/Time   First MD Initiated Contact with Patient 07/17/23 [phone]     (approximate)   History   Urinary Retention   HPI  Johnny Beard is a 87 y.o. male here with urinary retention.  The patient states that he started mirtazapine 2 nights ago.  He states that today, he began to feel fullness in his bladder and like he could not urinate.  He has a history of prostate issues and has had retention in the past requiring Foley placement.  No other new medications.  No fevers or chills.  No flank pain.  No nausea or vomiting.  No urinary symptoms.     Physical Exam   Triage Vital Signs: ED Triage Vitals  Encounter Vitals Group     BP 07/17/23 0317 (!) 175/91     Systolic BP Percentile --      Diastolic BP Percentile --      Pulse Rate 07/17/23 0317 77     Resp 07/17/23 0317 16     Temp 07/17/23 0317 97.6 F (36.4 C)     Temp Source 07/17/23 0317 Oral     SpO2 07/17/23 0317 98 %     Weight 07/17/23 0321 139 lb (63 kg)     Height 07/17/23 0321 5\' 8"  (1.727 m)     Head Circumference --      Peak Flow --      Pain Score 07/17/23 0321 9     Pain Loc --      Pain Education --      Exclude from Growth Chart --     Most recent vital signs: Vitals:   07/17/23 0317  BP: (!) 175/91  Pulse: 77  Resp: 16  Temp: 97.6 F (36.4 C)  SpO2: 98%     General: Awake, no distress.  CV:  Good peripheral perfusion.  Resp:  Normal work of breathing.  Abd:  No distention.  Mild suprapubic tenderness and fullness.  No CVA tenderness. Other:  Appears uncomfortable.   ED Results / Procedures / Treatments   Labs (all labs ordered are listed, but only abnormal results are displayed) Labs Reviewed  URINALYSIS, ROUTINE W REFLEX MICROSCOPIC - Abnormal; Notable for the following components:      Result Value   Color, Urine YELLOW (*)    APPearance CLEAR (*)    Hgb urine dipstick MODERATE (*)    All other components within  normal limits     EKG    RADIOLOGY    I also independently reviewed and agree with radiologist interpretations.   PROCEDURES:  Critical Care performed: No   MEDICATIONS ORDERED IN ED: Medications - No data to display   IMPRESSION / MDM / ASSESSMENT AND PLAN / ED COURSE  I reviewed the triage vital signs and the nursing notes.                              Differential diagnosis includes, but is not limited to, acute urinary retention, likely secondary to medication changes, BPH, prostatitis, UTI, stone  Patient's presentation is most consistent with acute presentation with potential threat to life or bodily function.  Very well-appearing 87 year old male here with urinary retention, likely secondary to BPH and recent initiation of mirtazapine.  He will stop the mirtazapine.  He had greater than 400 cc on his bladder scan, Foley catheter was placed with  resolution of his discomfort.  He has mild hematuria but no evidence of UTI.  He otherwise feels well.  This all began tonight and he does not appear to have any signs of swelling to suggest renal dysfunction.  He is otherwise hemodynamically stable.  Will discharge with outpatient urology follow-up and Foley catheter removal in 2 to 3 days.   FINAL CLINICAL IMPRESSION(S) / ED DIAGNOSES   Final diagnoses:  Urinary retention     Rx / DC Orders   ED Discharge Orders     None        Note:  This document was prepared using Dragon voice recognition software and may include unintentional dictation errors.   Shaune Pollack, MD 07/17/23 [phone]

## 2023-07-17 NOTE — Discharge Instructions (Addendum)
Call your UROLOGIST to set up an appointment in 2-3 days, early this week, for catheter removal  Continue your Flomax and other medications  STOP the Mirtazapine

## 2023-07-17 NOTE — ED Triage Notes (Signed)
Pt BIB by [facility] with complaints of inability to urinate since midnight. Pt reports hx of enlarged prostate and has had similar episodes int he past. Pt talks in complete sentences no respiratory distress noted

## 2024-09-10 ENCOUNTER — Emergency Department: Admission: EM | Admit: 2024-09-10 | Discharge: 2024-09-10 | Disposition: A | Discharge status: Home / Self Care

## 2024-09-10 ENCOUNTER — Other Ambulatory Visit: Payer: Self-pay

## 2024-09-10 DIAGNOSIS — T83098A Other mechanical complication of other indwelling urethral catheter, initial encounter: Secondary | ICD-10-CM | POA: Diagnosis present

## 2024-09-10 DIAGNOSIS — R5383 Other fatigue: Secondary | ICD-10-CM | POA: Diagnosis not present

## 2024-09-10 DIAGNOSIS — R531 Weakness: Secondary | ICD-10-CM | POA: Diagnosis not present

## 2024-09-10 DIAGNOSIS — Y732 Prosthetic and other implants, materials and accessory gastroenterology and urology devices associated with adverse incidents: Secondary | ICD-10-CM | POA: Diagnosis not present

## 2024-09-10 DIAGNOSIS — I10 Essential (primary) hypertension: Secondary | ICD-10-CM | POA: Diagnosis not present

## 2024-09-10 DIAGNOSIS — T839XXA Unspecified complication of genitourinary prosthetic device, implant and graft, initial encounter: Secondary | ICD-10-CM

## 2024-09-10 MED ORDER — ACETAMINOPHEN 500 MG PO TABS
1000.0000 mg | ORAL_TABLET | Freq: Once | ORAL | Status: AC
  Administered 2024-09-10: 1000 mg via ORAL
  Filled 2024-09-10: qty 2

## 2024-09-10 NOTE — ED Triage Notes (Signed)
 Pt reports he had foley placed today at [facility] for urinary retention, pt reports since he has noticed urine leaking from the tip of his penis and a burning sensation. Pt denies lower abd pain or discomfort, urine noted in drainage bag.

## 2024-09-10 NOTE — ED Provider Notes (Signed)
 [facility] Provider Note    Event Date/Time   First MD Initiated Contact with Patient 09/10/24 2118     (approximate)   History   Urinary Catheter Problem  Pt reports he had foley placed today at [facility] for urinary retention, pt reports since he has noticed urine leaking from the tip of his penis and a burning sensation. Pt denies lower abd pain or discomfort, urine noted in drainage bag.    HPI Johnny Beard is a 88 y.o. male PMH hypertension, bronchiectasis, BPH with history of urinary retention presents for evaluation of leaking around Foley catheter - Patient was seen at [facility] emergency department, see summary below.  Workup reassuring.  Was found to have recurrent urinary retention and Foley catheter was placed.  After going home he notes he had a large amount of urine that leaked out around the Foley catheter so comes to the emergency department for evaluation of Foley. - No other complaints at this time  Per chart review, patient was seen at the [facility] emergency department today for generalized weakness and fatigue.  Workup was unremarkable including viral testing, troponin, CMP, magnesium, CBC, urinalysis.  He was however found to have urinary retention and a Foley catheter was placed.     Physical Exam   Triage Vital Signs: ED Triage Vitals  Encounter Vitals Group     BP 09/10/24 1949 (!) 160/75     Girls Systolic BP Percentile --      Girls Diastolic BP Percentile --      Boys Systolic BP Percentile --      Boys Diastolic BP Percentile --      Pulse Rate 09/10/24 1949 88     Resp 09/10/24 1949 16     Temp 09/10/24 1949 98.4 F (36.9 C)     Temp src --      SpO2 09/10/24 1949 99 %     Weight 09/10/24 1948 136 lb (61.7 kg)     Height 09/10/24 1948 5' 7 (1.702 m)     Head Circumference --      Peak Flow --      Pain Score 09/10/24 1948 0     Pain Loc --      Pain Education --      Exclude from Growth Chart --      Most recent vital signs: Vitals:   09/10/24 1949  BP: (!) 160/75  Pulse: 88  Resp: 16  Temp: 98.4 F (36.9 C)  SpO2: 99%     General: Awake, no distress.  CV:  Good peripheral perfusion. Well perfused Resp:  Normal effort.  Abd:  No distention. Nontender to deep palpation throughout, no suprapubic distention GU:  Normal penile anatomy, Foley catheter in place draining clear yellow urine.  No obvious leakage around catheter appreciate time of my eval.   ED Results / Procedures / Treatments   Labs (all labs ordered are listed, but only abnormal results are displayed) Labs Reviewed - No data to display   EKG  N/a   RADIOLOGY N/a    PROCEDURES:  Critical Care performed: No  Procedures   MEDICATIONS ORDERED IN ED: Medications - No data to display   IMPRESSION / MDM / ASSESSMENT AND PLAN / ED COURSE  I reviewed the triage vital signs and the nursing notes.  DDX/MDM/AP: Differential diagnosis includes, but is not limited to, Foley catheter malfunction.  Has had Foley catheters in the past, suspect lumen is too small for urethra--Will replace with larger catheter.  No other complaints, workup reassuring earlier today, no indication for labs or imaging.  Plan: - Foley exchange --has size 16, will exchange for 18  Patient's presentation is most consistent with acute, uncomplicated illness.   ED course below.  Foley catheter exchanged and appears to be working appropriately.  Is already on Flomax, will continue and follow-up with his primary care provider and outpatient urologist.  ED return precautions in place.  Patient and family agree with plan.      FINAL CLINICAL IMPRESSION(S) / ED DIAGNOSES   Final diagnoses:  Problem with Foley catheter, initial encounter     Rx / DC Orders   ED Discharge Orders     None        Note:  This document was prepared using Dragon voice recognition software and may include  unintentional dictation errors.   Clarine Ozell LABOR, MD 09/10/24 2258

## 2024-09-10 NOTE — Discharge Instructions (Signed)
 Your evaluation in the emergency department was overall reassuring.  Your Foley catheter was well-placed with a larger one to help prevent further leaking.  Continue to take your tamsulosin as already prescribed and follow-up with your urologist and primary care provider for reevaluation.  Return to the emergency department with any new or worsening symptoms.
